# Patient Record
Sex: Female | Born: 1995 | Race: White | Hispanic: No | Marital: Single
Health system: Southern US, Community
[De-identification: ages and names within clinical notes are randomized; demographics above are authoritative.]

---

## 2019-04-12 ENCOUNTER — Other Ambulatory Visit: Payer: Self-pay | Admitting: *Deleted

## 2019-04-12 DIAGNOSIS — Z20822 Contact with and (suspected) exposure to covid-19: Secondary | ICD-10-CM

## 2019-04-13 LAB — NOVEL CORONAVIRUS, NAA: SARS-CoV-2, NAA: NOT DETECTED

## 2019-04-29 ENCOUNTER — Emergency Department: Payer: BLUE CROSS/BLUE SHIELD

## 2019-04-29 ENCOUNTER — Other Ambulatory Visit: Payer: Self-pay

## 2019-04-29 ENCOUNTER — Encounter: Payer: Self-pay | Admitting: Emergency Medicine

## 2019-04-29 ENCOUNTER — Emergency Department
Admission: EM | Admit: 2019-04-29 | Discharge: 2019-04-29 | Disposition: A | Discharge status: Home / Self Care | Payer: BLUE CROSS/BLUE SHIELD | Attending: Student | Admitting: Student

## 2019-04-29 DIAGNOSIS — Y939 Activity, unspecified: Secondary | ICD-10-CM | POA: Insufficient documentation

## 2019-04-29 DIAGNOSIS — Y929 Unspecified place or not applicable: Secondary | ICD-10-CM | POA: Diagnosis not present

## 2019-04-29 DIAGNOSIS — W228XXA Striking against or struck by other objects, initial encounter: Secondary | ICD-10-CM | POA: Insufficient documentation

## 2019-04-29 DIAGNOSIS — Z3202 Encounter for pregnancy test, result negative: Secondary | ICD-10-CM | POA: Insufficient documentation

## 2019-04-29 DIAGNOSIS — S060X0A Concussion without loss of consciousness, initial encounter: Secondary | ICD-10-CM | POA: Diagnosis not present

## 2019-04-29 DIAGNOSIS — S0990XA Unspecified injury of head, initial encounter: Secondary | ICD-10-CM | POA: Diagnosis present

## 2019-04-29 DIAGNOSIS — Y999 Unspecified external cause status: Secondary | ICD-10-CM | POA: Diagnosis not present

## 2019-04-29 LAB — POCT PREGNANCY, URINE: Preg Test, Ur: NEGATIVE

## 2019-04-29 MED ORDER — DIPHENHYDRAMINE HCL 50 MG/ML IJ SOLN
12.5000 mg | Freq: Once | INTRAMUSCULAR | Status: AC
  Administered 2019-04-29: 12.5 mg via INTRAVENOUS
  Filled 2019-04-29: qty 1

## 2019-04-29 MED ORDER — SODIUM CHLORIDE 0.9 % IV BOLUS
1000.0000 mL | Freq: Once | INTRAVENOUS | Status: AC
  Administered 2019-04-29: 1000 mL via INTRAVENOUS

## 2019-04-29 MED ORDER — ONDANSETRON HCL 4 MG/2ML IJ SOLN
4.0000 mg | Freq: Once | INTRAMUSCULAR | Status: AC
  Administered 2019-04-29: 4 mg via INTRAVENOUS
  Filled 2019-04-29: qty 2

## 2019-04-29 MED ORDER — KETOROLAC TROMETHAMINE 30 MG/ML IJ SOLN
30.0000 mg | Freq: Once | INTRAMUSCULAR | Status: AC
  Administered 2019-04-29: 30 mg via INTRAVENOUS
  Filled 2019-04-29: qty 1

## 2019-04-29 NOTE — ED Triage Notes (Signed)
Pt arrives with complaints of headache and sensitivity to light. Pt reports she had door fall on her head last night at 2200. Pt denies a loc. Pt reports pain since incident.

## 2019-04-29 NOTE — ED Notes (Signed)
See triage note   States she had a door fall on head  No LOC  Having headaches with light sensitivity

## 2019-04-29 NOTE — Discharge Instructions (Signed)
[facility]up [facility].  Follow-up with neurology if continued headaches.  You should refrain from any physical type activity which could cause another head injury until you have been headache free for 48 hours.  I would recommend you not do any testing for 1 week.  Also avoid bright lights, the blue light from phone and computer/TV.  Drink plenty fluids.  You do need to stay in a quiet environment for the next 2 days.

## 2019-04-29 NOTE — ED Provider Notes (Signed)
[facility] Emergency Department Provider Note  ____________________________________________   First MD Initiated Contact with Patient 04/29/19 1116     (approximate)  I have reviewed the triage vital signs and the nursing notes.   HISTORY  Chief Complaint Headache    HPI Kelsey Roth is a 23 y.o. female presents emergency department complaining of a headache after having a solid wooden door fall on her head yesterday.  No LOC at the time.  Patient's had a headache since.  States she has migraines but this feels different than her normal migraines.  States it is hard to open her eyes and lift her eyes to look upwards.  No vomiting.  Boyfriend states she is been, sluggish.  He states it is a heavy door that fell and hit her.  No vomiting.  No loss of vision.  No slurred speech.    History reviewed. No pertinent past medical history.  There are no active problems to display for this patient.   History reviewed. No pertinent surgical history.  Prior to Admission medications   Not on File    Allergies Patient has no allergy information on record.  No family history on file.  Social History Social History   Tobacco Use  . Smoking status: Not on file  Substance Use Topics  . Alcohol use: Not on file  . Drug use: Not on file    Review of Systems  Constitutional: No fever/chills Head: Positive for head injury Eyes: No visual changes. ENT: No sore throat. Respiratory: Denies cough Genitourinary: Negative for dysuria. Musculoskeletal: Negative for back pain. Skin: Negative for rash.    ____________________________________________   PHYSICAL EXAM:  VITAL SIGNS: ED Triage Vitals [04/29/19 1103]  Enc Vitals Group     BP 112/67     Pulse Rate 78     Resp 17     Temp 98.2 F (36.8 C)     Temp Source Oral     SpO2 98 %     Weight 135 lb (61.2 kg)     Height 5' 4.75" (1.645 m)     Head Circumference      Peak Flow      Pain  Score 8     Pain Loc      Pain Edu?      Excl. in [place]?     Constitutional: Alert and oriented. Well appearing and in no acute distress. Eyes: Conjunctivae are normal. perrl Head: Tender swollen area on the left side of the scalp Nose: No congestion/rhinnorhea. Mouth/Throat: Mucous membranes are moist.   Neck:  supple no lymphadenopathy noted Cardiovascular: Normal rate, regular rhythm. Heart sounds are normal Respiratory: Normal respiratory effort.  No retractions, lungs c t a  GU: deferred Musculoskeletal: FROM all extremities, warm and well perfused Neurologic:  Normal speech and language.  Cranial nerves II through XII grossly intact Skin:  Skin is warm, dry and intact. No rash noted. Psychiatric: Mood and affect are normal. Speech and behavior are normal.  ____________________________________________   LABS (all labs ordered are listed, but only abnormal results are displayed)  Labs Reviewed  POCT PREGNANCY, URINE   ____________________________________________   ____________________________________________  RADIOLOGY  CT of the head is negative  ____________________________________________   PROCEDURES  Procedure(s) performed: Saline lock, Toradol 30 mg IV, Benadryl 12.5 mg IV, Zofran 4 mg IV  Procedures    ____________________________________________   INITIAL IMPRESSION / ASSESSMENT AND PLAN / ED COURSE  Pertinent labs & imaging results that  were available during my care of the patient were reviewed by me and considered in my medical decision making (see chart for details).   Patient is 23 year old Kelsey Roth presents emergency department complaining of a headache after a door fell on her head last night.  Physical exam patient appears to be sensitive to light.  No slurred speech is noted.  Left-sided scalp is tender to palpation.  Remainder of exam is unremarkable  CT of the head POC pregnancy is negative  CT of the head is negative. Patient was given a  IV dose of Toradol 30 mg, Benadryl 12.5 mg, and Zofran 4 mg.  Normal saline 1 L IV.  Discussed her case with her father who is an ophthalmologist.  Explained him I think she has some mild concussion.  After the medications through the IV the patient is able to focus and has no nystagmus.  States she feels much better.  She be discharged in stable condition.  Concussion restrictions were given to her.     Kelsey Roth was evaluated in Emergency Department on 04/29/2019 for the symptoms described in the history of present illness. She was evaluated in the context of the global COVID-19 pandemic, which necessitated consideration that the patient might be at risk for infection with the SARS-CoV-2 virus that causes COVID-19. Institutional protocols and algorithms that pertain to the evaluation of patients at risk for COVID-19 are in a state of rapid change based on information released by regulatory bodies including the CDC and federal and state organizations. These policies and algorithms were followed during the patient's care in the ED.   As part of my medical decision making, I reviewed the following data within the electronic MEDICAL RECORD NUMBER History obtained from family, Nursing notes reviewed and incorporated, Labs reviewed POC pregnancy is negative, Old chart reviewed, Radiograph reviewed CT of the head is negative, Notes from prior ED visits and [place] Controlled Substance Database  ____________________________________________   FINAL CLINICAL IMPRESSION(S) / ED DIAGNOSES  Final diagnoses:  Concussion without loss of consciousness, initial encounter      NEW MEDICATIONS STARTED DURING THIS VISIT:  New Prescriptions   No medications on file     Note:  This document was prepared using Dragon voice recognition software and may include unintentional dictation errors.    Faythe Ghee, PA-C 04/29/19 1331    Miguel Aschoff., MD 04/30/19 [phone]

## 2019-09-27 ENCOUNTER — Other Ambulatory Visit: Payer: Self-pay | Admitting: Acute Care

## 2019-09-27 DIAGNOSIS — R569 Unspecified convulsions: Secondary | ICD-10-CM

## 2019-10-05 ENCOUNTER — Other Ambulatory Visit: Payer: Self-pay

## 2019-10-05 ENCOUNTER — Ambulatory Visit
Admission: RE | Admit: 2019-10-05 | Discharge: 2019-10-05 | Disposition: A | Discharge status: Home / Self Care | Payer: BLUE CROSS/BLUE SHIELD | Source: Ambulatory Visit | Attending: Acute Care | Admitting: Acute Care

## 2019-10-05 DIAGNOSIS — R569 Unspecified convulsions: Secondary | ICD-10-CM | POA: Diagnosis present

## 2020-10-08 IMAGING — MR MR HEAD W/O CM
12 series · 47 of 48 positions shown · non-contrast
Comparison: Head CT of April 29, 2019

CLINICAL DATA: Head trauma. Right eye twitching, headaches and
memory issues.

EXAM:
MRI HEAD WITHOUT CONTRAST
TECHNIQUE: Multiplanar, multiecho pulse sequences of the brain and surrounding
structures were obtained without intravenous contrast.

[Series 5: ax dwi_tracew · axial · 3.0mm · 0.60mm/px · z∈[-146,+8]mm · 3 of 48 slices shown]
[im 1/48]
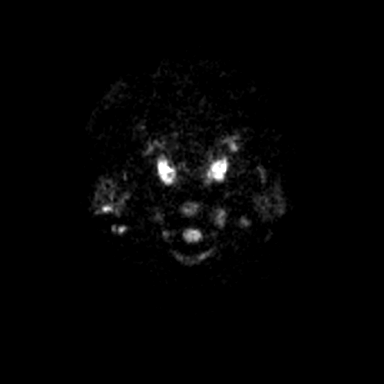
[im 24/48]
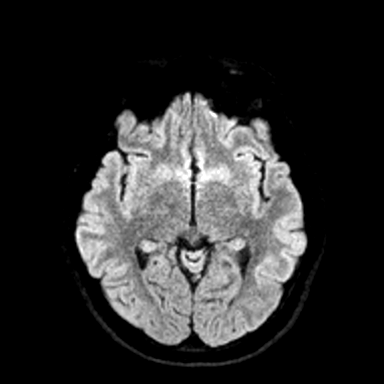
[im 48/48]
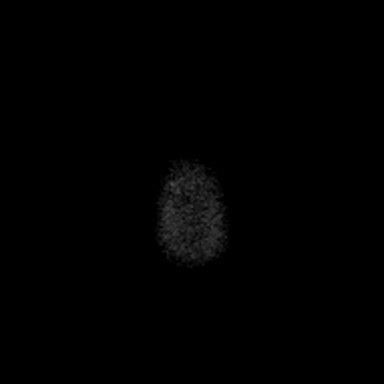

[Series 6: ax dwi_adc · axial · 3.0mm · 0.60mm/px · z∈[-146,+8]mm · 3 of 48 slices shown]
[im 1/48]
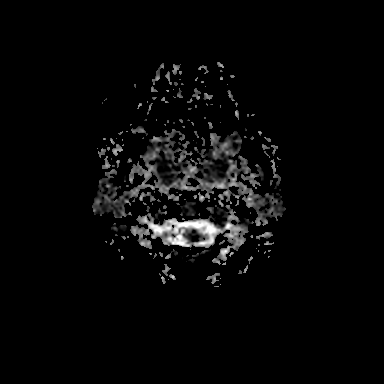
[im 24/48]
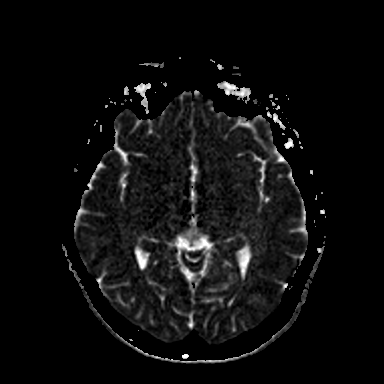
[im 48/48]
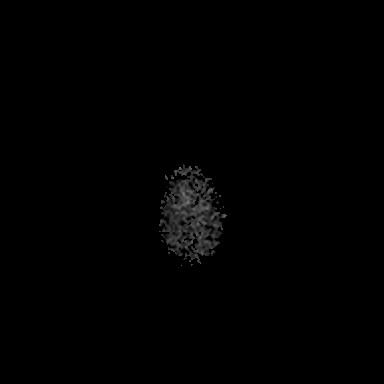

[Series 7: cor dwi_tracew · coronal · 5.0mm · 0.60mm/px · 3 of 34 slices shown]
[im 1/34]
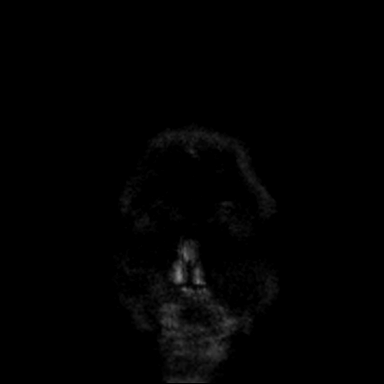
[im 17/34]
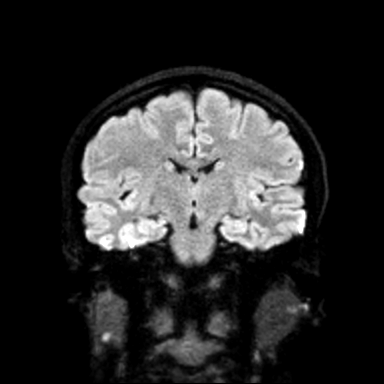
[im 34/34]
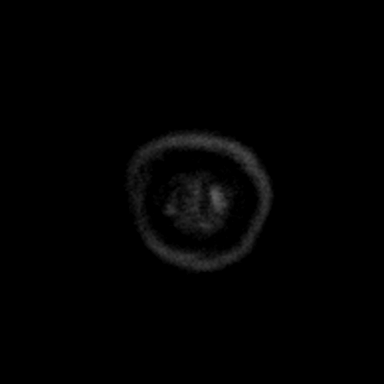

[Series 8: cor dwi_adc · coronal · 5.0mm · 0.60mm/px · 3 of 33 slices shown]
[im 1/33]
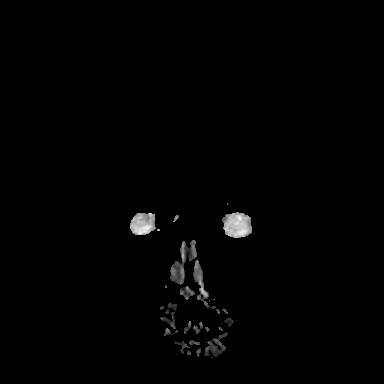
[im 17/33]
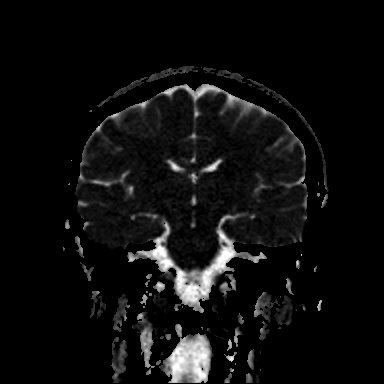
[im 33/33]
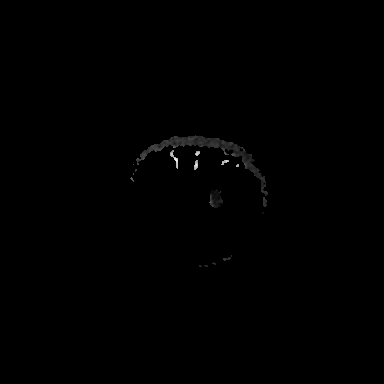

[Series 9: T1 · sagittal · 5.0mm · 0.62mm/px · 2 of 22 slices shown (1 of 2)]
[im 1/22]
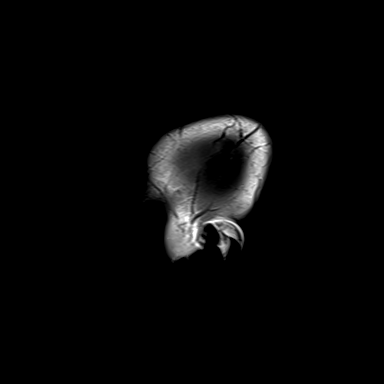
[im 22/22]
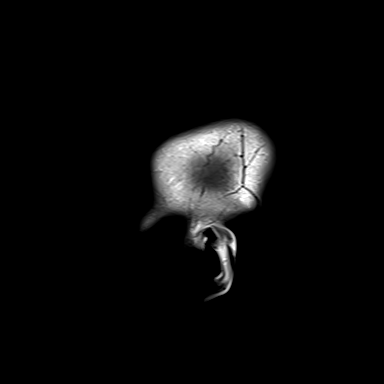

[Series 10: T2 · axial · 5.0mm · 0.53mm/px · z∈[-139,+3]mm · 2 of 25 slices shown (1 of 2)]
[im 1/25]
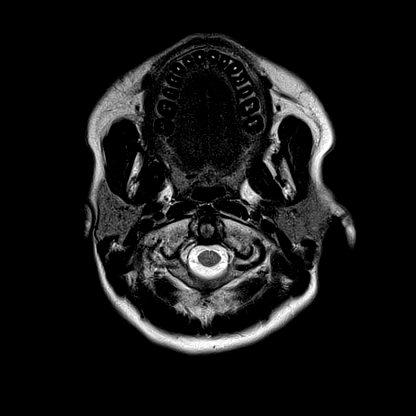
[im 25/25]
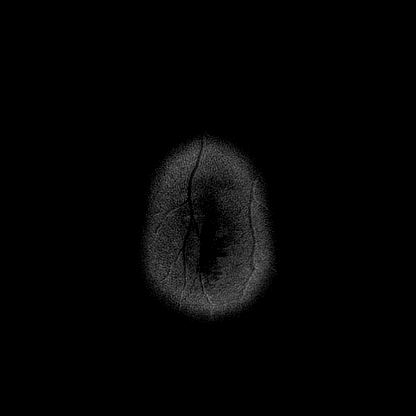

[Series 11: mag_images · axial · 3.0mm · 0.90mm/px · z∈[-156,+19]mm · 5 of 60 slices shown]
[im 1/60]
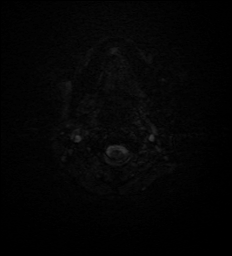
[im 15/60]
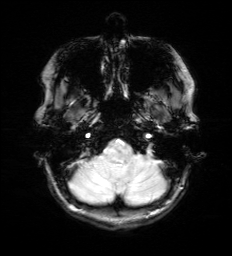
[im 30/60]
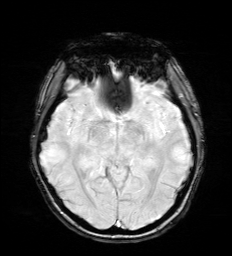
[im 45/60]
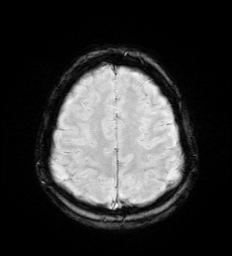
[im 60/60]
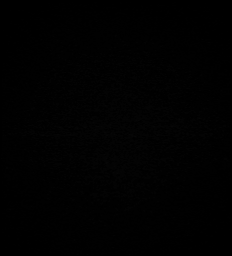

[Series 12: pha_images · axial · 3.0mm · 0.90mm/px · z∈[-156,+19]mm · 4 of 58 slices shown]
[im 1/58]
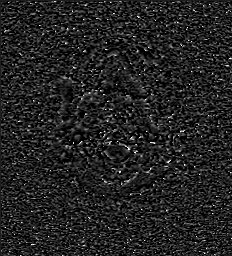
[im 20/58]
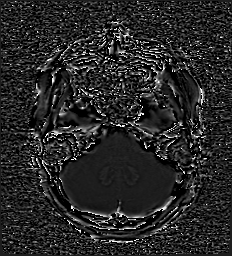
[im 39/58]
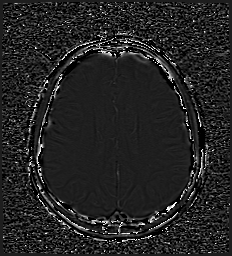
[im 58/58]
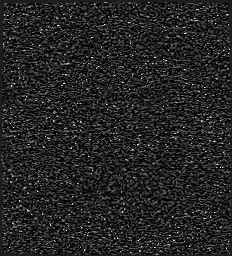

[Series 13: swi_images · axial · 3.0mm · 0.90mm/px · z∈[-156,+19]mm · 5 of 60 slices shown]
[im 1/60]
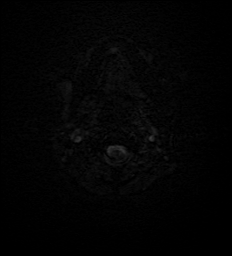
[im 15/60]
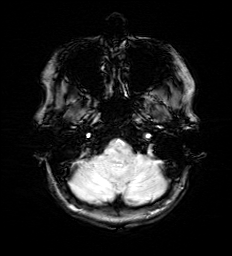
[im 30/60]
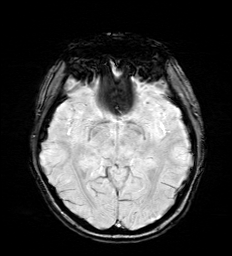
[im 45/60]
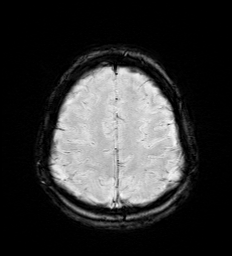
[im 60/60]
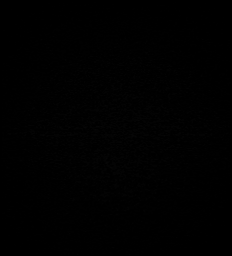

[Series 15: FLAIR · axial · 3.0mm · 0.53mm/px · z∈[-148,+12]mm · 4 of 55 slices shown]
[im 1/55]
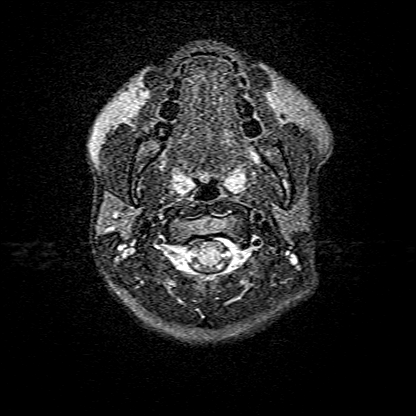
[im 19/55]
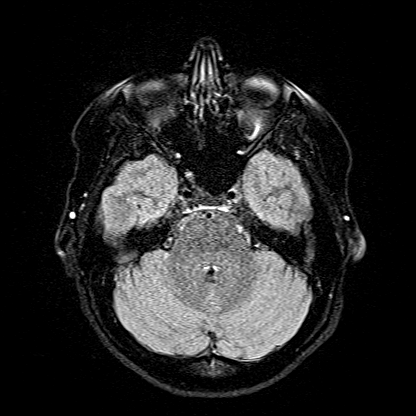
[im 37/55]
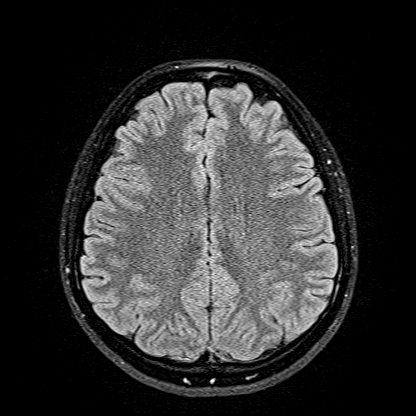
[im 55/55]
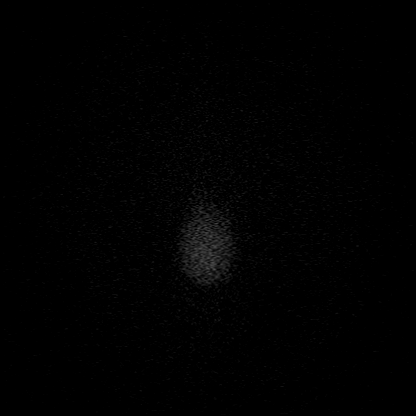

[Series 16: T1 · axial · 1.0mm · 0.98mm/px · z∈[-149,+8]mm · 11 of 160 slices shown (2 of 2)]
[im 1/160]
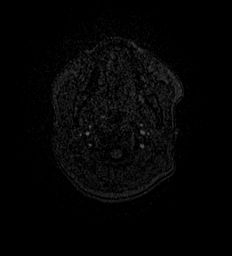
[im 15/160]
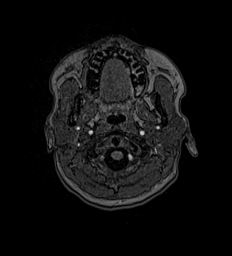
[im 29/160]
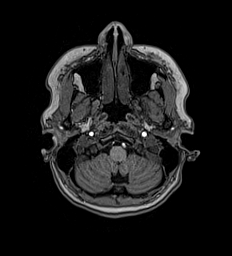
[im 44/160]
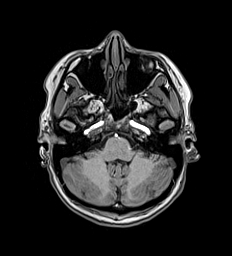
[im 58/160]
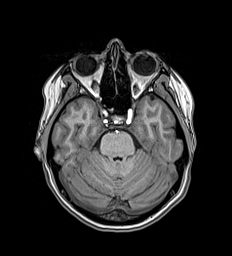
[im 73/160]
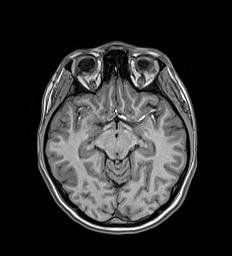
[im 87/160]
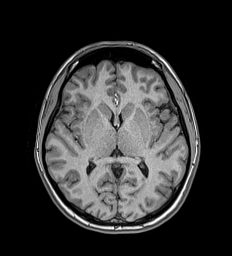
[im 102/160]
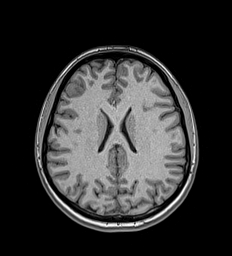
[im 116/160]
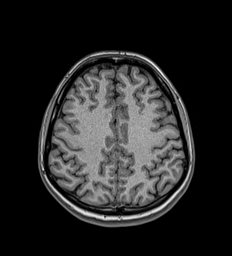
[im 131/160]
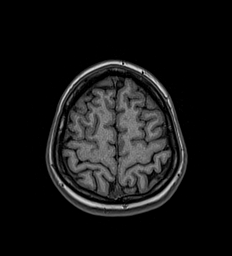
[im 160/160]
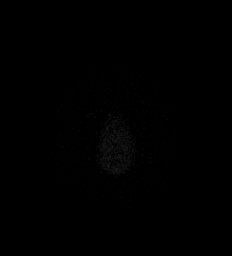

[Series 17: T2 · coronal · 5.0mm · 0.57mm/px · 2 of 27 slices shown (2 of 2)]
[im 1/27]
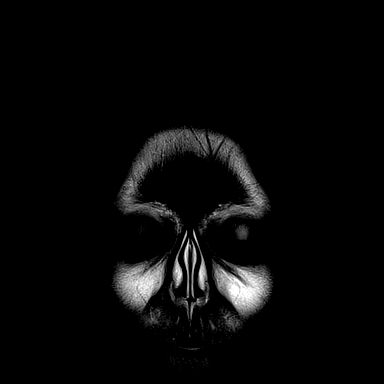
[im 27/27]
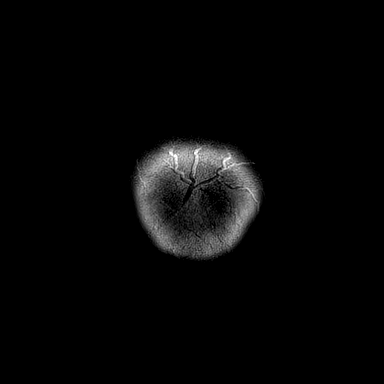

[47 of 48 positions shown; findings below may reference images not displayed]

FINDINGS: Brain: No acute infarction, hemorrhage, hydrocephalus, extra-axial
collection or mass lesion. The brain parenchyma has normal
morphology and signal characteristics.

Vascular: Normal flow voids.

Skull and upper cervical spine: Normal marrow signal.

Sinuses/Orbits: Negative.

Other: None.
IMPRESSION: Unremarkable MRI of the brain.
